# Patient Record
Sex: Male | Born: 1960 | Race: White | Hispanic: No | Marital: Married | Smoking: Former smoker
Health system: Southern US, Community
[De-identification: ages and names within clinical notes are randomized; demographics above are authoritative.]

## PROBLEM LIST (undated history)

## (undated) DIAGNOSIS — T7840XA Allergy, unspecified, initial encounter: Secondary | ICD-10-CM

## (undated) DIAGNOSIS — K739 Chronic hepatitis, unspecified: Secondary | ICD-10-CM

## (undated) DIAGNOSIS — L439 Lichen planus, unspecified: Secondary | ICD-10-CM

## (undated) DIAGNOSIS — I10 Essential (primary) hypertension: Secondary | ICD-10-CM

## (undated) DIAGNOSIS — I2510 Atherosclerotic heart disease of native coronary artery without angina pectoris: Secondary | ICD-10-CM

## (undated) DIAGNOSIS — Z9889 Other specified postprocedural states: Secondary | ICD-10-CM

## (undated) DIAGNOSIS — I219 Acute myocardial infarction, unspecified: Secondary | ICD-10-CM

## (undated) DIAGNOSIS — E785 Hyperlipidemia, unspecified: Secondary | ICD-10-CM

## (undated) DIAGNOSIS — Z86010 Personal history of colonic polyps: Secondary | ICD-10-CM

## (undated) HISTORY — DX: Acute myocardial infarction, unspecified: I21.9

## (undated) HISTORY — DX: Chronic hepatitis, unspecified: K73.9

## (undated) HISTORY — PX: OTHER SURGICAL HISTORY: SHX169

## (undated) HISTORY — DX: Atherosclerotic heart disease of native coronary artery without angina pectoris: I25.10

## (undated) HISTORY — DX: Hyperlipidemia, unspecified: E78.5

## (undated) HISTORY — DX: Allergy, unspecified, initial encounter: T78.40XA

## (undated) HISTORY — DX: Personal history of colonic polyps: Z86.010

## (undated) HISTORY — PX: VENTRAL HERNIA REPAIR: SHX424

## (undated) HISTORY — DX: Other specified postprocedural states: Z98.890

## (undated) HISTORY — DX: Lichen planus, unspecified: L43.9

## (undated) HISTORY — DX: Essential (primary) hypertension: I10

## (undated) HISTORY — PX: HERNIA REPAIR: SHX51

## (undated) HISTORY — PX: ROTATOR CUFF REPAIR: SHX139

---

## 2007-07-27 HISTORY — PX: CORONARY ANGIOPLASTY WITH STENT PLACEMENT: SHX49

## 2008-02-20 ENCOUNTER — Inpatient Hospital Stay (HOSPITAL_COMMUNITY): Admission: AD | Admit: 2008-02-20 | Discharge: 2008-02-23 | Payer: Self-pay | Admitting: Cardiology

## 2008-02-20 ENCOUNTER — Emergency Department (HOSPITAL_COMMUNITY): Admission: EM | Admit: 2008-02-20 | Discharge: 2008-02-20 | Payer: Self-pay | Admitting: Emergency Medicine

## 2008-02-20 DIAGNOSIS — Z9889 Other specified postprocedural states: Secondary | ICD-10-CM

## 2008-02-20 DIAGNOSIS — I219 Acute myocardial infarction, unspecified: Secondary | ICD-10-CM

## 2008-02-20 HISTORY — DX: Acute myocardial infarction, unspecified: I21.9

## 2008-02-20 HISTORY — DX: Other specified postprocedural states: Z98.890

## 2008-03-22 DIAGNOSIS — I2510 Atherosclerotic heart disease of native coronary artery without angina pectoris: Secondary | ICD-10-CM

## 2008-03-22 HISTORY — DX: Atherosclerotic heart disease of native coronary artery without angina pectoris: I25.10

## 2008-07-01 ENCOUNTER — Emergency Department (HOSPITAL_COMMUNITY): Admission: EM | Admit: 2008-07-01 | Discharge: 2008-07-02 | Payer: Self-pay | Admitting: Emergency Medicine

## 2009-06-10 IMAGING — CR DG CHEST 1V PORT
1 series · 1 of 1 positions shown · non-contrast
Comparison: None available

CLINICAL DATA: chest pain and pressure

PORTABLE CHEST - 1 VIEW

[view not recorded]
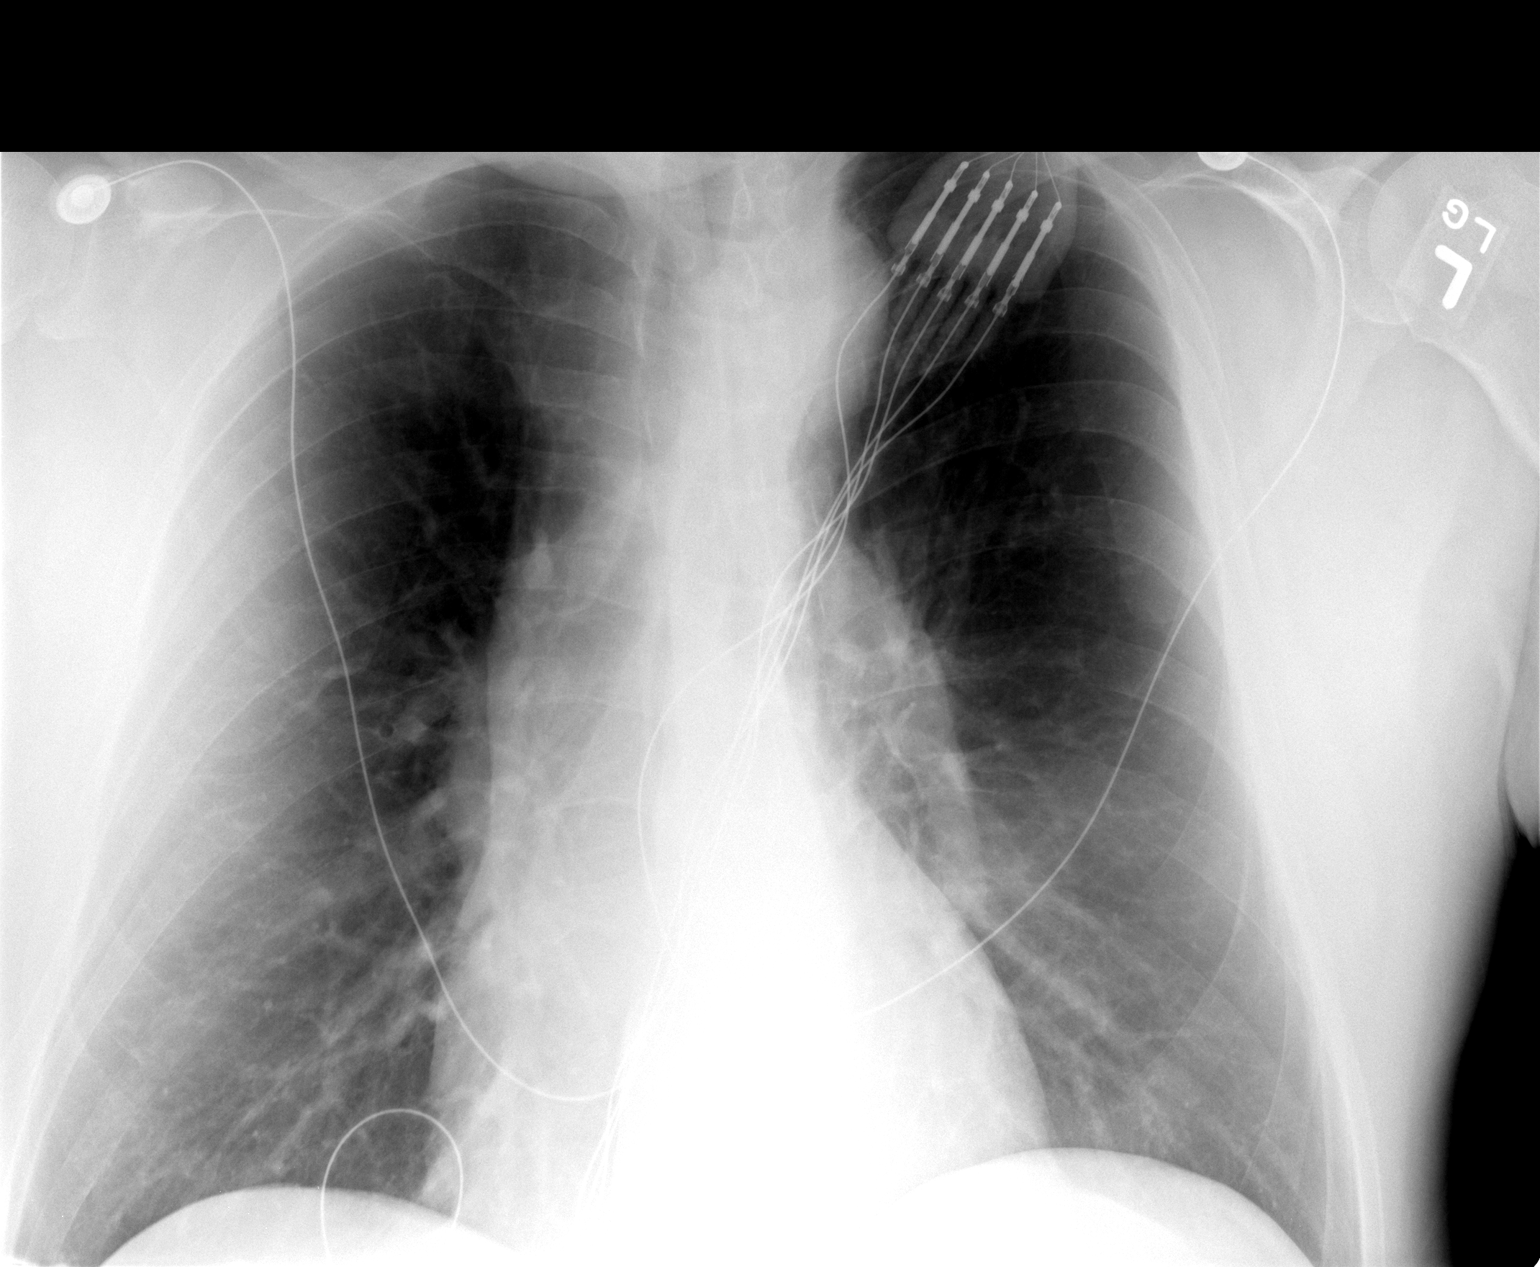

[1 of 1 positions shown; findings below may reference images not displayed]

FINDINGS: The lungs appear hyperinflated without focal infiltrate
or overt edema.  Heart size normal.  No definite effusion although
lateral costophrenic angles are excluded.  Visualized bones
unremarkable.
IMPRESSION: Hyperinflation without acute or superimposed abnormality

## 2009-12-18 ENCOUNTER — Ambulatory Visit: Payer: Self-pay | Admitting: Gastroenterology

## 2010-07-26 HISTORY — PX: KNEE ARTHROSCOPY W/ MENISCECTOMY: SHX1879

## 2010-12-08 NOTE — Cardiovascular Report (Signed)
NAMEJARRIS, KORTZ NO.:  [id number]   MEDICAL RECORD NO.:  [id number]          PATIENT TYPE:  INP   LOCATION:  2902                         FACILITY:  [facility]   PHYSICIAN:  Madaline Savage, M.D.DATE OF BIRTH:  20-Oct-1960   DATE OF PROCEDURE:  02/20/2008  DATE OF DISCHARGE:                            CARDIAC CATHETERIZATION   PROCEDURES PERFORMED:  1. Selective coronary angiography by Judkins technique.  2. Retrograde left heart catheterization.  3. Left ventricular angiography.  4. Intracoronary artery stenting of the mid circumflex coronary artery      after predilatation with a balloon angioplasty.   COMPLICATIONS:  None.   ENTRY SITE:  Right femoral.   DYE USED:  Omnipaque.   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  He  had an EKG showing sinus tachycardia at a rate of 100 a minute with what  looked like a junctional tachycardia and some nonspecific T-wave  abnormalities.  There was a septal Q-wave in lead V2 which raised the  possibility of a septal infarct, age undetermined.  The patient's  troponin peaked at 1.02.  He was painfree at the time he arrived in the  cath lab.  This cath was completed from the right percutaneous femoral  artery approach using diagnostic catheters that were 5-French initially  and then upgraded to 6-French in order to complete the percutaneous  intervention.   ANGIOGRAPHIC RESULTS:  The patient's left main coronary artery was a  very large vessel approximately 5 mm in diameter and that left main  coronary artery was normal.  The LAD showed a gradually tapering  stenosis and then a mirror image appearance on the other side of the  stenosis of 60%.  The distal LAD showed mild luminal  irregularities, but  no significant lesions.   The circumflex coronary artery was codominant with the right coronary  artery and gave rise to 2 obtuse marginal branches.  There was an area  of aneurysmal dilatation in the mid circumflex followed by a tight  stenosis of 95% over a very short segment of approximately 4-6 mm.  The  distal obtuse marginal branch was a normal, but tortuous vessel.   The right coronary artery was a codominant vessel with the circumflex  and showed an S-shaped configuration proximally in this vessel with  hypodense areas in that S-shaped turn.  The 1 lesion that I saw in this  large dominant RCA was a posterior descending branch stenosis of about  80%-90% that PDA vessel was approximately 2 mm to 2.25 mm in diameter.  The remainder of the vessel appeared to show only luminal  irregularities.   Left ventricular angiogram showed vigorous contractility of all wall  segments, but there was one isolated focal area in the midportion of the  anterior lateral wall that was hypokinetic, therefore I estimate  ejection fraction at 50%.  No mitral regurgitation  seen.   The patient had received Lovenox approximately 4-5 hours prior to this  diagnostic catheterization.  I, therefore, gave the patient a 2b3a  inhibitor, specifically Integrilin.  I loaded the patient with 600 mg of  Plavix and I gave him 40 mg of intravenous famotidine.  The guide  catheter used for the intervention on the circumflex was a Cordis 4.0 XB  LAD guide catheter.  The guidewire was a Research scientist (physical sciences).  The  predilatation of the vessel was accomplished with an apex balloon.  I  then was able to cross the stenosis with a 3.0 x 18 mm Vision stent and  I postdilated the Vision stent with a noncompliant balloon and achieved  a lumen diameter estimated at 3.3 mm.  There was preservation of TIMI  III flow in this vessel.  No complications occurred.   The patient then received an Angio-Seal closure device  of his right  femoral artery because I did not want to risk the chances of him having  a bleeding following the Lovenox 4-5 hours prior to this intervention.   FINAL IMPRESSIONS:  1. Three-vessel coronary artery disease, newly diagnosed.      a.     60% proximal left anterior descending stenosis.      b.     Posterior descending branch stenosis of 90%.  2. Culprit vessel for myocardial infarction mid circumflex 95% to 0%      residual after intervention.  3. Left ventricular ejection fraction 50% with focal anterolateral      wall hypokinesis.  4. Successful Angio-Seal closure of the right femoral artery.   PLAN:  The patient will be followed overnight and then will be  considered for ambulation and may be a candidate for early discharge.           ______________________________  Madaline Savage, M.D.     WHG/MEDQ  D:  02/20/2008  T:  02/21/2008  Job:  [id number]   cc:   [facility]  Medical Records [facility] [address]  [facility]

## 2010-12-08 NOTE — H&P (Signed)
Neil Chavez, Neil Chavez NO.:  [id number]   MEDICAL RECORD NO.:  [id number]          PATIENT TYPE:  INP   LOCATION:  2902                         FACILITY:  [facility]   PHYSICIAN:  Madaline Savage, M.D.DATE OF BIRTH:  09/20/1960   DATE OF ADMISSION:  02/20/2008  DATE OF DISCHARGE:                              HISTORY & PHYSICAL   CHIEF COMPLAINT:  Throat pain.   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  He came to the emergency  room today with complaints of throat pain, described as an ache or  spasm.  This was not relieved after drinking cold water.  This is the  second episode of this he has had, the first one was a couple days ago  and lasted 5 minutes.  He denies any associated shortness of breath,  diaphoresis, or nausea.  He does admit to some radiation in to his upper  chest and the back of his neck.  He describes his symptoms as severe  this morning, onset was while he was lying in bed.  His symptoms  resolved after about 20 minutes on the way to the emergency room  spontaneously.  In the emergency room, his troponins were positive at  0.2 and 0.35.  His EKG is abnormal with intermittent junctional rhythm  and septal Q-waves.  He is currently pain free.   His past medical history is unremarkable for hypertension or diabetes,  and his lipid status is unknown.  He has had prior abdominal hernia  repair and bilateral inguinal hernia repairs.  He has had bilateral  rotator cuff repair.  He is on no medications at home, and he has no  known drug allergies.   SOCIAL HISTORY:  He is married.  He has three children.  He quit smoking  12 years ago, he used to smoke 1-pack a day for 15 years.   FAMILY HISTORY:  Remarkable for coronary artery disease, his mother has  had an MI and bypass in her 69s.  His father is alive and well in his  42s without disease.   REVIEW OF SYSTEMS:  The  patient did have a workup for shortness of  breath after exercise about a year ago in [place].  At that time, he had a  negative exercise Myoview.  He does admit to some constipation and  bloating, but he has not had diarrhea.  He denies any GI bleeding or  melena.  He has not had history of hypertension or diabetes.   PHYSICAL EXAM:  VITAL SIGNS:  Blood pressure 120/85, pulse 80,  temperature 98, and O2 sat 97% on 2 L.  GENERAL:  He is a well-developed, well-nourished male in no acute  distress.  HEENT:  Normocephalic.  Extraocular movements are intact.  Sclera is  nonicteric.  Lids and conjunctivae are within normal limits.  NECK:  Without bruit or JVD.  Thyroid is not enlarged.  CHEST:  Clear to auscultation and percussion.  CARDIAC:  Regular rate and rhythm without murmur, rub, or gallop.  Normal S1  and S2.  ABDOMEN:  Nontender.  No hepatosplenomegaly.  EXTREMITIES:  Without edema.  Distal pulses are 3+/4 bilaterally.  No  femoral bruits.  NEUROLOGIC:  Grossly intact.  He is awake, alert, oriented, and  cooperative.  He moves all extremities without obvious deficits.  SKIN:  Warm and dry.   LABORATORY DATA:  White count 8.0, hemoglobin 16.5, hematocrit 46.6, and  platelets 180.  Sodium 141, potassium 3.7, BUN 18, creatinine 0.9,  troponins are 0.2 and 0.35, CK-MB is 146 with 4.0 MB.  Chest x-ray shows  hyperinflation.   EKG #1 shows an accelerated junctional rhythm with a rate of 100 and Q-  wave in V2.  Follow up EKG done 10 minutes ago shows sinus rhythm with Q-  wave  in V2.   IMPRESSION:  1. Unstable angina.  2. History of smoking.  3. Transient junctional rhythm, accelerated with ventricular response      of 100.  4. Family history of coronary artery disease.   PLAN:  The patient was given Lovenox in the emergency room and started  on nitro paste and aspirin.  We will hold off his beta-blocker since he  has had some transient junctional rhythm.  He will need to be set  up for  diagnostic catheterization, this could be done this afternoon.  The  patient understands the risk and benefits of the procedure and is  willing to proceed.      [name], P.A.    ______________________________  Madaline Savage, M.D.    [name]  D:  02/20/2008  T:  02/21/2008  Job:  [id number]

## 2010-12-08 NOTE — Discharge Summary (Signed)
NAMESANFORD, Neil Chavez NO.:  [id number]   MEDICAL RECORD NO.:  [id number]          PATIENT TYPE:  INP   LOCATION:  2023                         FACILITY:  [facility]   PHYSICIAN:  Madaline Savage, M.D.DATE OF BIRTH:  31-Dec-1960   DATE OF ADMISSION:  02/20/2008  DATE OF DISCHARGE:  02/23/2008                               DISCHARGE SUMMARY   DISCHARGE DIAGNOSES:  1. ST-segment elevation myocardial infarction this admission, treated      with circumflex multilength stenting.  2. Residual 60% left anterior descending narrowing.  3. Preserved left ventricular function.  4. Dyslipidemia.  5. Gastroesophageal reflux symptoms.  6. History of smoking.   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  He said this was not associated with any diaphoresis  or shortness of breath, but he did have some radiation to his upper  chest.  At [facility] Emergency Room, he had a positive troponin and  transient accelerated junctional rhythm.  He was taken to the Cath Lab  by Dr. Elsie Lincoln.  He was started on heparin, aspirin, and nitrates preop.  Catheterization revealed normal RCA with 90% distal PDA stenosis, EF of  50%, normal left main, 95% midcircumflex stenosis, and 60% LAD.  The  circumflex stenosis was treated with multilength Vision stent.  He did  have positive enzymes with a peak CK of 120 and 6 MBs and a troponin of  1.08.  He was transferred to telemetry.  Statin and low-dose beta  blocker were added.  He has also had some gastroesophageal reflux  symptoms and has had gastroesophageal reflux in the past.  A PPI was  added at discharge.  At some point, we may want to add an ACE inhibitor,  although his blood pressure was borderline in the hospital.  I had a  long discussion with the patient regarding statin therapy and he is very  reluctant, but will consider  giving this a trial.   DISCHARGE MEDICATIONS:  1. Coated aspirin 325 mg a day.  2. Plavix 75 mg a day.  3. Simvastatin 40 mg a day.  4. Toprol-XL 25 mg a day.  5. Omeprazole 20 mg a day.  6. Nitroglycerin sublingual p.r.n.   LABORATORY DATA:  Cholesterol 157, LDL 97, HDL 34, triglycerides 129,  troponin peak of 1.08.  Sodium 140, potassium 3.8, BUN 11, creatinine  0.9. White count 5.4, hemoglobin 14.5, hematocrit 42, platelets 172.  Magnesium 2.1.  TSH 1.18.  Chest x-ray:  Hyperinflation without acute  disease.  INR 1.1.  EKG:  Sinus rhythm, septal Q-waves.   DISPOSITION:  The patient is discharged in stable condition and will  follow up with Dr. Elsie Lincoln in a week or two.  He will need a exercise  Myoview study to assess his LAD in a couple weeks.      [name], P.A.       ______________________________  Madaline Savage, M.D.    [name]  D:  02/23/2008  T:  02/23/2008  Job:  [id number] 

## 2011-04-23 LAB — CBC
MCHC: 34.6
MCHC: 35.4
MCV: 91.4
MCV: 91.6
RBC: 4.47
RBC: 4.59
RBC: 5.24
WBC: 5.4

## 2011-04-23 LAB — BASIC METABOLIC PANEL
BUN: 12
CO2: 26
Calcium: 8.8
Chloride: 104
Chloride: 106
Creatinine, Ser: 0.77
Creatinine, Ser: 0.91
GFR calc Af Amer: >60
GFR calc Af Amer: >60
GFR calc non Af Amer: >60

## 2011-04-23 LAB — DIFFERENTIAL
Basophils Absolute: 0
Basophils Relative: 0
Lymphocytes Relative: 16
Monocytes Relative: 11
Neutro Abs: 5.6
Neutrophils Relative %: 70

## 2011-04-23 LAB — TSH: TSH: 1.185

## 2011-04-23 LAB — LIPID PANEL
HDL: 34 — ABNORMAL LOW
Total CHOL/HDL Ratio: 4.6

## 2011-04-23 LAB — POCT I-STAT, CHEM 8
Chloride: 107
HCT: 46
Potassium: 3.7

## 2011-04-23 LAB — CK TOTAL AND CKMB (NOT AT ARMC)
CK, MB: 10.6 — ABNORMAL HIGH
Relative Index: 3.8 — ABNORMAL HIGH
Relative Index: 7.9 — ABNORMAL HIGH
Total CK: 115
Total CK: 120
Total CK: 134
Total CK: 146

## 2011-04-23 LAB — APTT: aPTT: 32

## 2011-04-23 LAB — POCT CARDIAC MARKERS
Myoglobin, poc: 92.2
Troponin i, poc: 0.2 — ABNORMAL HIGH

## 2011-04-23 LAB — CARDIAC PANEL(CRET KIN+CKTOT+MB+TROPI)
Total CK: 116
Troponin I: 1.08

## 2011-04-23 LAB — TROPONIN I: Troponin I: 0.63

## 2011-04-30 LAB — URINALYSIS, ROUTINE W REFLEX MICROSCOPIC
Glucose, UA: NEGATIVE mg/dL
Nitrite: NEGATIVE
Specific Gravity, Urine: 1.009 (ref 1.005–1.030)
pH: 7 (ref 5.0–8.0)

## 2011-04-30 LAB — DIFFERENTIAL
Basophils Absolute: 0 10*3/uL (ref 0.0–0.1)
Basophils Relative: 0 % (ref 0–1)
Monocytes Relative: 13 % — ABNORMAL HIGH (ref 3–12)
Neutro Abs: 4.1 10*3/uL (ref 1.7–7.7)
Neutrophils Relative %: 60 % (ref 43–77)

## 2011-04-30 LAB — HEPATIC FUNCTION PANEL
AST: 30 U/L (ref 0–37)
Albumin: 4.2 g/dL (ref 3.5–5.2)
Total Protein: 6.8 g/dL (ref 6.0–8.3)

## 2011-04-30 LAB — CBC
MCHC: 34.6 g/dL (ref 30.0–36.0)
Platelets: 198 10*3/uL (ref 150–400)
RBC: 5.32 MIL/uL (ref 4.22–5.81)
RDW: 13.1 % (ref 11.5–15.5)

## 2011-04-30 LAB — POCT I-STAT, CHEM 8
BUN: 12 mg/dL (ref 6–23)
Chloride: 106 mEq/L (ref 96–112)
HCT: 48 % (ref 39.0–52.0)
Sodium: 142 mEq/L (ref 135–145)

## 2011-04-30 LAB — POCT CARDIAC MARKERS
Myoglobin, poc: 60.1 ng/mL (ref 12–200)
Troponin i, poc: <0.05 ng/mL (ref 0.00–0.09)

## 2011-04-30 LAB — LIPASE, BLOOD: Lipase: 38 U/L (ref 11–59)

## 2012-10-01 ENCOUNTER — Encounter: Payer: Self-pay | Admitting: *Deleted

## 2012-10-23 ENCOUNTER — Encounter: Payer: Self-pay | Admitting: Internal Medicine

## 2013-02-18 ENCOUNTER — Other Ambulatory Visit: Payer: Self-pay | Admitting: Internal Medicine

## 2013-02-19 NOTE — Telephone Encounter (Signed)
Rx was sent to pharmacy electronically. 

## 2013-04-23 ENCOUNTER — Other Ambulatory Visit: Payer: Self-pay | Admitting: Internal Medicine

## 2013-04-23 NOTE — Telephone Encounter (Signed)
#  30 only for Irbesartan - NEEDS APPT.

## 2013-05-04 ENCOUNTER — Encounter: Payer: Self-pay | Admitting: Internal Medicine

## 2013-05-04 ENCOUNTER — Ambulatory Visit (INDEPENDENT_AMBULATORY_CARE_PROVIDER_SITE_OTHER): Payer: BC Managed Care – PPO | Admitting: Internal Medicine

## 2013-05-04 VITALS — BP 142/94 | HR 65 | Ht 70.0 in | Wt 214.8 lb

## 2013-05-04 DIAGNOSIS — E785 Hyperlipidemia, unspecified: Secondary | ICD-10-CM | POA: Insufficient documentation

## 2013-05-04 DIAGNOSIS — K739 Chronic hepatitis, unspecified: Secondary | ICD-10-CM

## 2013-05-04 DIAGNOSIS — I2510 Atherosclerotic heart disease of native coronary artery without angina pectoris: Secondary | ICD-10-CM

## 2013-05-04 DIAGNOSIS — B1920 Unspecified viral hepatitis C without hepatic coma: Secondary | ICD-10-CM | POA: Insufficient documentation

## 2013-05-04 NOTE — Progress Notes (Signed)
OFFICE NOTE  Chief Complaint:  Routine follow-up  Primary Care Physician: Neil Dials, MD  HPI:  Neil Chavez is a 52 year old gentleman who has a history of ST elevation MI in 2009 and PCI to the LAD. He has been intolerant to a number of medications and not interested in taking them in the past. Recently he has been re-referred because of his history of coronary disease and wants to know more about medications that may be beneficial to him. He is overall asymptomatic, denies any chest pain, shortness of breath, palpitations, presyncope or syncopal symptoms. At his last visit as recommended starting low-dose ARB medication given his history of coronary disease. He does have a low heart rate at rest and has been intolerant to Lopressor in the past. He has restarted taking Lipitor 10 mg and has tolerated that over the past year. She does have a history of hepatitis C with a very low viral load and has not had a problem with liver function in the past. His last lipid profile was in an MR performed 11 2013. The LDL-P was 1783, LDL-C 139, HDL-C. 47. Symptomatically he is doing fairly well. 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 He is investigating undergoing treatment for his hepatitis C possibly in December or January when the medication is available.  PMHx:  Past Medical History  Diagnosis Date  . CAD (coronary artery disease) 03-22-2008    lexiscan stress test. EF 55% no EKG changes. NEG for Ichemia. Low risk scan  . History of cardiac cath 02-20-2008    final immpression= three vessel CAD. 60% prox. ant. desending stenosis.posterior desending branch 90% stenosis. intra coronary stenting mid circumflex after predilatation with baloon angioplasty.3.0x63mm vision stent placed  . Hypertension   . Chronic hepatitis   . Lichen planus   . Myocardial infarction 02-20-2008     stent placed by Dr. Lavonne Chavez    Past Surgical History  Procedure Laterality Date  . Rotator cuff repair  2001-2003  . Inguinal hernia repair      bilaterial  . Ventral hernia repair    . Hernia repair      FAMHx:  Family History  Problem Relation Age of Onset  . Hypertension Mother     SOCHx:   reports that he quit smoking about 25 years ago. His smoking use included Cigarettes. He smoked 0.00 packs per day. He has never used smokeless tobacco. He reports that he drinks alcohol. He reports that he does not use illicit drugs.  ALLERGIES:  Allergies  Allergen Reactions  . Metoprolol     Rapid pulse , swelling,dizziness,edema    ROS: A comprehensive review of systems was negative except for: Gastrointestinal: positive for hepatitis C  HOME MEDS: Current Outpatient Prescriptions  Medication Sig Dispense Refill  . aspirin 81 MG tablet Take 81 mg by mouth daily.      Marland Kitchen atorvastatin (LIPITOR) 10 MG tablet TAKE 1 TABLET BY MOUTH EVERY NIGHT AT BEDTIME  30 tablet  6  . irbesartan (AVAPRO) 75 MG tablet TAKE 1 TABLET BY MOUTH ONCE DAILY  30 tablet  0   No current facility-administered medications for this visit.    LABS/IMAGING: No results found for this or any previous visit (from the past 48 hour(s)). No results found.  VITALS: BP 142/94  Pulse 65  Ht 5\' 10"  (1.778 m)  Wt  214 lb 12.8 oz (97.433 kg)  BMI 30.82 kg/m2  EXAM: General appearance: alert and no distress Neck: no adenopathy, no carotid bruit, no JVD, supple, symmetrical, trachea midline and thyroid not enlarged, symmetric, no tenderness/mass/nodules Lungs: clear to auscultation bilaterally Heart: regular rate and rhythm, S1, S2 normal, no murmur, click, rub or gallop Abdomen: soft, non-tender; bowel sounds normal; no masses,  no organomegaly Extremities: extremities normal, atraumatic, no cyanosis or edema Pulses: 2+ and symmetric Skin: Skin color, texture, turgor normal. No rashes or lesions Neurologic: Grossly normal Psych:  Mood, affect normal  EKG: Sinus rhythm at 65  ASSESSMENT: 1. History of ST elevation MI and PCI to the LAD in 2009 2. Dyslipidemia-on Lipitor which she is tolerating 3. Hepatitis C  PLAN: 1.   Mr. Neil Chavez is doing very well without recurrence of his chest pain or any further MIs since 2009.  He has low-grade hepatitis C but is still viremic and is anticipating undergoing curative treatment in the next 3-6 months. He may need to discontinue his statin during that period of time. He is currently on atorvastatin which is tolerated for the past year and I like to see a decrease in his particle number on that medication. We may need to consider titrating it. Also like to check a liver profile to make sure there's been no change in his liver enzymes. Otherwise it continued to stress exercise and weight loss. 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 Plan to see him back annually or sooner as necessary and I will contact him with the results of his lipid profile and adjust medications accordingly.  Neil Nose, MD, [facility] Attending Cardiologist [facility]  Neil Chavez C 05/04/2013, 9:02 AM

## 2013-05-04 NOTE — Patient Instructions (Addendum)
Your physician recommends that you return for lab work in the next few weeks. You will need to be fasting for this blood work.  Your physician wants you to follow-up in: 1 year. You will receive a reminder letter in the mail two months in advance. If you don't receive a letter, please call our office to schedule the follow-up appointment.

## 2013-05-11 ENCOUNTER — Other Ambulatory Visit: Payer: Self-pay | Admitting: Internal Medicine

## 2013-05-11 LAB — HEPATIC FUNCTION PANEL
ALT: 34 U/L (ref 0–53)
AST: 29 U/L (ref 0–37)
Bilirubin, Direct: 0.2 mg/dL (ref 0.0–0.3)
Indirect Bilirubin: 0.8 mg/dL (ref 0.0–0.9)
Total Protein: 6.9 g/dL (ref 6.0–8.3)

## 2013-05-11 LAB — CREATININE, SERUM: Creat: 0.88 mg/dL (ref 0.50–1.35)

## 2013-05-11 LAB — AST: AST: 29 U/L (ref 0–37)

## 2013-05-11 LAB — ALT: ALT: 34 U/L (ref 0–53)

## 2013-05-11 LAB — PROTIME-INR: INR: 0.98 (ref <1.50)

## 2013-05-11 LAB — ALBUMIN: Albumin: 4.7 g/dL (ref 3.5–5.2)

## 2013-05-14 LAB — NMR LIPOPROFILE WITH LIPIDS
Cholesterol, Total: 153 mg/dL (ref <200)
HDL Size: 9.3 nm (ref >=9.2)
LDL Particle Number: 1285 nmol/L — ABNORMAL HIGH (ref <1000)
Large HDL-P: 8.5 umol/L (ref >=4.8)
Large VLDL-P: 1.7 nmol/L (ref <=2.7)
Small LDL Particle Number: 861 nmol/L — ABNORMAL HIGH (ref <=527)
Triglycerides: 154 mg/dL — ABNORMAL HIGH (ref <150)
VLDL Size: 41 nm (ref <=46.6)

## 2013-05-15 LAB — HEPATITIS C GENOTYPE

## 2013-05-17 NOTE — Progress Notes (Signed)
LMTCB

## 2013-05-18 ENCOUNTER — Encounter: Payer: Self-pay | Admitting: *Deleted

## 2013-05-18 NOTE — Progress Notes (Signed)
Attempted to call patient - 05/18/13

## 2013-05-21 ENCOUNTER — Telehealth: Payer: Self-pay | Admitting: Internal Medicine

## 2013-05-21 NOTE — Telephone Encounter (Signed)
Message forwarded to J. Elkins, RN.  

## 2013-05-21 NOTE — Telephone Encounter (Signed)
Calling back about lab results. 

## 2013-05-21 NOTE — Telephone Encounter (Signed)
LM informing patient that results had been mailed on Friday 05/18/13

## 2013-05-30 ENCOUNTER — Other Ambulatory Visit: Payer: Self-pay | Admitting: Internal Medicine

## 2013-05-30 NOTE — Telephone Encounter (Signed)
Rx was sent to pharmacy electronically. 

## 2013-08-30 ENCOUNTER — Encounter: Payer: Self-pay | Admitting: Internal Medicine

## 2013-10-01 ENCOUNTER — Other Ambulatory Visit: Payer: Self-pay | Admitting: Internal Medicine

## 2013-10-01 NOTE — Telephone Encounter (Signed)
Rx was sent to pharmacy electronically. 

## 2013-11-02 ENCOUNTER — Ambulatory Visit (AMBULATORY_SURGERY_CENTER): Payer: BC Managed Care – PPO | Admitting: *Deleted

## 2013-11-02 VITALS — Ht 69.5 in | Wt 202.8 lb

## 2013-11-02 DIAGNOSIS — Z1211 Encounter for screening for malignant neoplasm of colon: Secondary | ICD-10-CM

## 2013-11-02 MED ORDER — NA SULFATE-K SULFATE-MG SULF 17.5-3.13-1.6 GM/177ML PO SOLN: 1.0000 | Freq: Once | ORAL | Status: DC

## 2013-11-02 NOTE — Progress Notes (Signed)
No egg or soy allergy. No anesthesia problems.  

## 2013-11-06 ENCOUNTER — Encounter: Payer: Self-pay | Admitting: Internal Medicine

## 2013-11-16 ENCOUNTER — Encounter: Payer: Self-pay | Admitting: Internal Medicine

## 2013-11-16 ENCOUNTER — Ambulatory Visit (AMBULATORY_SURGERY_CENTER): Payer: BC Managed Care – PPO | Admitting: Internal Medicine

## 2013-11-16 VITALS — BP 109/77 | HR 54 | Temp 97.4°F | Resp 19 | Ht 69.5 in | Wt 202.0 lb

## 2013-11-16 DIAGNOSIS — Z860100 Personal history of colon polyps, unspecified: Secondary | ICD-10-CM | POA: Insufficient documentation

## 2013-11-16 DIAGNOSIS — Z1211 Encounter for screening for malignant neoplasm of colon: Secondary | ICD-10-CM

## 2013-11-16 DIAGNOSIS — K5730 Diverticulosis of large intestine without perforation or abscess without bleeding: Secondary | ICD-10-CM

## 2013-11-16 DIAGNOSIS — D126 Benign neoplasm of colon, unspecified: Secondary | ICD-10-CM

## 2013-11-16 HISTORY — DX: Personal history of colonic polyps: Z86.010

## 2013-11-16 MED ORDER — SODIUM CHLORIDE 0.9 % IV SOLN: 500.0000 mL | INTRAVENOUS | Status: DC

## 2013-11-16 NOTE — Progress Notes (Signed)
Called to room to assist during endoscopic procedure.  Patient ID and intended procedure confirmed with present staff. Received instructions for my participation in the procedure from the performing physician.  

## 2013-11-16 NOTE — Progress Notes (Signed)
Procedure ends, to recovery, report given and VSS. 

## 2013-11-16 NOTE — Op Note (Signed)
[place]  [address]. [place], [address]   COLONOSCOPY PROCEDURE REPORT  PATIENT: Neil Chavez, Neil Chavez  MR#: [id number] BIRTHDATE: [birth year] , 52  yrs. old GENDER: Male ENDOSCOPIST: Gatha Mayer, MD, [facility] REFERRED LK:TGYBW Bouska, M.D. PROCEDURE DATE:  11/16/2013 PROCEDURE:   Colonoscopy with snare polypectomy First Screening Colonoscopy - Avg.  risk and is 50 yrs.  old or older Yes.  Prior Negative Screening - Now for repeat screening. N/A  History of Adenoma - Now for follow-up colonoscopy & has been > or = to 3 yrs.  N/A  Polyps Removed Today? Yes. ASA CLASS:   Class II INDICATIONS:average risk screening and first colonoscopy. MEDICATIONS: Propofol (Diprivan) 330 mg IV, MAC sedation, administered by CRNA, and These medications were titrated to patient response per physician's verbal order  DESCRIPTION OF PROCEDURE:   After the risks benefits and alternatives of the procedure were thoroughly explained, informed consent was obtained.  A digital rectal exam revealed no abnormalities of the rectum, A digital rectal exam revealed no prostatic nodules, and A digital rectal exam revealed the prostate was not enlarged.   The LB [id number]  endoscope was introduced through the anus and advanced to the cecum, which was identified by both the appendix and ileocecal valve. No adverse events experienced.   The quality of the prep was Suprep good  The instrument was then slowly withdrawn as the colon was fully examined.   COLON FINDINGS: Two sessile polyps measuring 7-8 mm in size were found at the cecum and in the ascending colon.  A polypectomy was performed with a cold snare.  The resection was complete and the polyp tissue was completely retrieved.   Mild diverticulosis was noted in the sigmoid colon.   The colon mucosa was otherwise normal.   A right colon retroflexion was performed.  Retroflexed views revealed no abnormalities. The time to cecum=1  minutes 56 seconds.  Withdrawal time=10 minutes 06 seconds.  The scope was withdrawn and the procedure completed. COMPLICATIONS: There were no complications.  ENDOSCOPIC IMPRESSION: 1.   Two sessile polyps measuring 7-8 mm in size were found at the cecum and in the ascending colon; polypectomy was performed with a cold snare 2.   Mild diverticulosis was noted in the sigmoid colon 3.   The colon mucosa was otherwise normal - good prep - fiirst colonoscopy  RECOMMENDATIONS: Timing of repeat colonoscopy will be determined by pathology findings.  eSigned:  Gatha Mayer, MD, [facility] 11/16/2013 9:19 AM   cc: Bernerd Limbo, MD and The Patient

## 2013-11-16 NOTE — Patient Instructions (Addendum)
I found and removed 2 polyps that look benign. You also have diverticulosis - a common condition that is not usually a problem. No other abnormalities.  I will let you know pathology results and when to have another routine colonoscopy by mail.  I appreciate the opportunity to care for you. Gatha Mayer, MD, FACG  YOU HAD AN ENDOSCOPIC PROCEDURE TODAY AT [facility]: Refer to the procedure report that was given to you for any specific questions about what was found during the examination.  If the procedure report does not answer your questions, please call your gastroenterologist to clarify.  If you requested that your care partner not be given the details of your procedure findings, then the procedure report has been included in a sealed envelope for you to review at your convenience later.  YOU SHOULD EXPECT: Some feelings of bloating in the abdomen. Passage of more gas than usual.  Walking can help get rid of the air that was put into your GI tract during the procedure and reduce the bloating. If you had a lower endoscopy (such as a colonoscopy or flexible sigmoidoscopy) you may notice spotting of blood in your stool or on the toilet paper. If you underwent a bowel prep for your procedure, then you may not have a normal bowel movement for a few days.  DIET: Your first meal following the procedure should be a light meal and then it is ok to progress to your normal diet.  A half-sandwich or bowl of soup is an example of a good first meal.  Heavy or fried foods are harder to digest and may make you feel nauseous or bloated.  Likewise meals heavy in dairy and vegetables can cause extra gas to form and this can also increase the bloating.  Drink plenty of fluids but you should avoid alcoholic beverages for 24 hours.  ACTIVITY: Your care partner should take you home directly after the procedure.  You should plan to take it easy, moving slowly for the rest of the day.  You can resume  normal activity the day after the procedure however you should NOT DRIVE or use heavy machinery for 24 hours (because of the sedation medicines used during the test).    SYMPTOMS TO REPORT IMMEDIATELY: A gastroenterologist can be reached at any hour.  During normal business hours, 8:30 AM to 5:00 PM Monday through Friday, call [phone].  After hours and on weekends, please call the GI answering service at [phone] who will take a message and have the physician on call contact you.   Following lower endoscopy (colonoscopy or flexible sigmoidoscopy):  Excessive amounts of blood in the stool  Significant tenderness or worsening of abdominal pains  Swelling of the abdomen that is new, acute  Fever of 100F or higher  FOLLOW UP: If any biopsies were taken you will be contacted by phone or by letter within the next 1-3 weeks.  Call your gastroenterologist if you have not heard about the biopsies in 3 weeks.  Our staff will call the home number listed on your records the next business day following your procedure to check on you and address any questions or concerns that you may have at that time regarding the information given to you following your procedure. This is a courtesy call and so if there is no answer at the home number and we have not heard from you through the emergency physician on call, we will assume that you have  returned to your regular daily activities without incident.  SIGNATURES/CONFIDENTIALITY: You and/or your care partner have signed paperwork which will be entered into your electronic medical record.  These signatures attest to the fact that that the information above on your After Visit Summary has been reviewed and is understood.  Full responsibility of the confidentiality of this discharge information lies with you and/or your care-partner.  Polyps, Diverticulosis-handouts given  Repeat colonoscopy will be determined by pathology.

## 2013-11-19 ENCOUNTER — Telehealth: Payer: Self-pay

## 2013-11-19 NOTE — Telephone Encounter (Signed)
  Follow up Call-  Call back number 11/16/2013  Post procedure Call Back phone  # [phone]  Permission to leave phone message Yes     Patient questions:  Do you have a fever, pain , or abdominal swelling? no Pain Score  0 *  Have you tolerated food without any problems? yes  Have you been able to return to your normal activities? yes  Do you have any questions about your discharge instructions: Diet   no Medications  no Follow up visit  no   Do you have questions or concerns about your Care? no  Actions: * If pain score is 4 or above: No action needed, pain <4.   No problems per the pt. maw

## 2013-11-22 ENCOUNTER — Encounter: Payer: Self-pay | Admitting: Internal Medicine

## 2014-01-10 ENCOUNTER — Other Ambulatory Visit: Payer: Self-pay | Admitting: Internal Medicine

## 2014-03-15 ENCOUNTER — Encounter: Payer: Self-pay | Admitting: Internal Medicine

## 2014-03-15 ENCOUNTER — Ambulatory Visit (INDEPENDENT_AMBULATORY_CARE_PROVIDER_SITE_OTHER): Payer: BC Managed Care – PPO | Admitting: Internal Medicine

## 2014-03-15 VITALS — BP 148/92 | HR 71 | Ht 70.0 in | Wt 206.0 lb

## 2014-03-15 DIAGNOSIS — B182 Chronic viral hepatitis C: Secondary | ICD-10-CM

## 2014-03-15 DIAGNOSIS — E785 Hyperlipidemia, unspecified: Secondary | ICD-10-CM

## 2014-03-15 DIAGNOSIS — I2510 Atherosclerotic heart disease of native coronary artery without angina pectoris: Secondary | ICD-10-CM

## 2014-03-15 MED ORDER — IRBESARTAN 75 MG PO TABS: 75.0000 mg | ORAL_TABLET | Freq: Every day | ORAL | Status: DC

## 2014-03-15 MED ORDER — ATORVASTATIN CALCIUM 10 MG PO TABS: 10.0000 mg | ORAL_TABLET | Freq: Every day | ORAL | Status: AC

## 2014-03-15 NOTE — Progress Notes (Signed)
OFFICE NOTE  Chief Complaint:  Routine follow-up  Primary Care Physician: Phineas Inches, MD  HPI:  Neil Chavez is a 53 year old gentleman who has a history of ST elevation MI in 2009 and PCI to the LAD. He has been intolerant to a number of medications and not interested in taking them in the past. Recently he has been re-referred because of his history of coronary disease and wants to know more about medications that may be beneficial to him. He is overall asymptomatic, denies any chest pain, shortness of breath, palpitations, presyncope or syncopal symptoms. At his last visit as recommended starting low-dose ARB medication given his history of coronary disease. He does have a low heart rate at rest and has been intolerant to Lopressor in the past. He has restarted taking Lipitor 10 mg and has tolerated that over the past year. She does have a history of hepatitis Chavez with a very low viral load and has not had a problem with liver function in the past. His last lipid profile was in an MR performed 11 2013. The LDL-P was 1783, LDL-Chavez 139, HDL-Chavez. 47. Symptomatically he is doing fairly well. 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 He is investigating undergoing treatment for his hepatitis Chavez possibly in December or January when the medication is available.  Neil Chavez returns today and is having no complaints. He denies any chest pain or worsening shortness of breath. He still not undergone any treatment for his hepatitis Chavez however he is aware that new medications are available they're very effective with minimal side effects.  PMHx:  Past Medical History  Diagnosis Date  . CAD (coronary artery disease) 03-22-2008    lexiscan stress test. EF 55% no EKG changes. NEG for Ichemia. Low risk scan  . History of cardiac cath 02-20-2008    final immpression= three vessel CAD. 60% prox. ant. desending  stenosis.posterior desending branch 90% stenosis. intra coronary stenting mid circumflex after predilatation with baloon angioplasty.3.0x54mm vision stent placed  . Hypertension   . Chronic hepatitis   . Lichen planus   . Myocardial infarction 02-20-2008     stent placed by Dr. Janene Madeira  . Allergy     seasonal  . Hyperlipidemia   . Personal history of colonic polyps - sessile serrated polyps 11/16/2013    Past Surgical History  Procedure Laterality Date  . Rotator cuff repair  2001-2003  . Inguinal hernia repair      bilaterial  . Ventral hernia repair    . Hernia repair    . Knee arthroscopy w/ meniscectomy Left 2012  . Coronary angioplasty with stent placement  2009    FAMHx:  Family History  Problem Relation Age of Onset  . Hypertension Mother   . Colon cancer Neg Hx   . Stomach cancer Neg Hx     SOCHx:   reports that he quit smoking about 25 years ago. His smoking use included Cigarettes. He smoked 0.00 packs per day. He has never used smokeless tobacco. He reports that he drinks alcohol. He reports that he does not use illicit drugs.  ALLERGIES:  Allergies  Allergen Reactions  . Metoprolol     Rapid pulse , swelling,dizziness,edema    ROS: A comprehensive review of systems was negative except for: Gastrointestinal: positive for hepatitis Chavez  HOME MEDS: Current Outpatient Prescriptions  Medication Sig Dispense Refill  .  aspirin 81 MG tablet Take 81 mg by mouth daily.      Marland Kitchen atorvastatin (LIPITOR) 10 MG tablet Take 1 tablet (10 mg total) by mouth daily.  90 tablet  2  . finasteride (PROSCAR) 5 MG tablet Take 5 mg by mouth daily.       . irbesartan (AVAPRO) 75 MG tablet Take 1 tablet (75 mg total) by mouth daily.  90 tablet  2  . sildenafil (VIAGRA) 100 MG tablet Take 100 mg by mouth daily as needed for erectile dysfunction.       No current facility-administered medications for this visit.    LABS/IMAGING: No results found for this or any previous visit  (from the past 48 hour(s)). No results found.  VITALS: BP 148/92  Pulse 71  Ht 5\' 10"  (1.778 m)  Wt 206 lb (93.441 kg)  BMI 29.56 kg/m2  EXAM: General appearance: alert and no distress Neck: no adenopathy, no carotid bruit, no JVD, supple, symmetrical, trachea midline and thyroid not enlarged, symmetric, no tenderness/mass/nodules Lungs: clear to auscultation bilaterally Heart: regular rate and rhythm, S1, S2 normal, no murmur, click, rub or gallop Abdomen: soft, non-tender; bowel sounds normal; no masses,  no organomegaly Extremities: extremities normal, atraumatic, no cyanosis or edema Pulses: 2+ and symmetric Skin: Skin color, texture, turgor normal. No rashes or lesions Neurologic: Grossly normal Psych: Mood, affect normal  EKG: Sinus rhythm at 71  ASSESSMENT: 1. History of ST elevation MI and PCI to the LAD in 2009 2. Dyslipidemia-on Lipitor which she is tolerating 3. Hepatitis Chavez  PLAN: 1.   Neil Chavez is doing very well without recurrence of his chest pain or any further MIs since 2009.  I recommend continuing his current medications. I have again encouraged him to see Hepatologist for treatment of his hepatitis Chavez. He tells me today that he will be moving to [place], and will need a new cardiologist in the [facility] area. Unfortunately do not have any referral contacts in that area however I am happy to provide records office information to his new cardiologist.  Pixie Casino, MD, [facility] Attending Cardiologist [facility]  Neil Chavez,Neil Chavez 03/15/2014, 1:38 PM

## 2014-03-15 NOTE — Patient Instructions (Signed)
No follow up needed due to patient moving.   Rx refills were sent into your pharmacy

## 2014-03-20 NOTE — Addendum Note (Signed)
Addended by: Fidel Levy on: 03/20/2014 01:10 PM   Modules accepted: Orders

## 2014-07-20 ENCOUNTER — Other Ambulatory Visit: Payer: Self-pay | Admitting: Internal Medicine

## 2014-07-22 NOTE — Telephone Encounter (Signed)
Rx(s) sent to pharmacy electronically.  

## 2018-12-08 ENCOUNTER — Encounter: Payer: Self-pay | Admitting: Internal Medicine
